# Patient Record
Sex: Female | Born: 1945 | Race: Black or African American | Hispanic: No | Marital: Married
Health system: Southern US, Community
[De-identification: ages and names within clinical notes are randomized; demographics above are authoritative.]

---

## 2013-01-31 ENCOUNTER — Other Ambulatory Visit: Payer: Self-pay

## 2013-01-31 DIAGNOSIS — Z1231 Encounter for screening mammogram for malignant neoplasm of breast: Secondary | ICD-10-CM

## 2013-02-19 ENCOUNTER — Ambulatory Visit: Payer: Self-pay

## 2013-03-13 ENCOUNTER — Ambulatory Visit: Payer: Self-pay

## 2013-12-28 ENCOUNTER — Ambulatory Visit (HOSPITAL_COMMUNITY)
Admission: RE | Admit: 2013-12-28 | Discharge: 2013-12-28 | Disposition: A | Discharge status: Home / Self Care | Payer: 59 | Source: Ambulatory Visit | Attending: Internal Medicine | Admitting: Internal Medicine

## 2013-12-28 ENCOUNTER — Other Ambulatory Visit (HOSPITAL_COMMUNITY): Payer: Self-pay | Admitting: Internal Medicine

## 2013-12-28 DIAGNOSIS — M47817 Spondylosis without myelopathy or radiculopathy, lumbosacral region: Secondary | ICD-10-CM | POA: Insufficient documentation

## 2013-12-28 DIAGNOSIS — M545 Low back pain, unspecified: Secondary | ICD-10-CM

## 2014-09-26 IMAGING — CR DG LUMBAR SPINE COMPLETE 4+V
5 series · 5 of 5 positions shown · non-contrast
Comparison: None.

CLINICAL DATA: Low back pain.

EXAM:
LUMBAR SPINE - COMPLETE 4+ VIEW

[t lumbar spine ap]
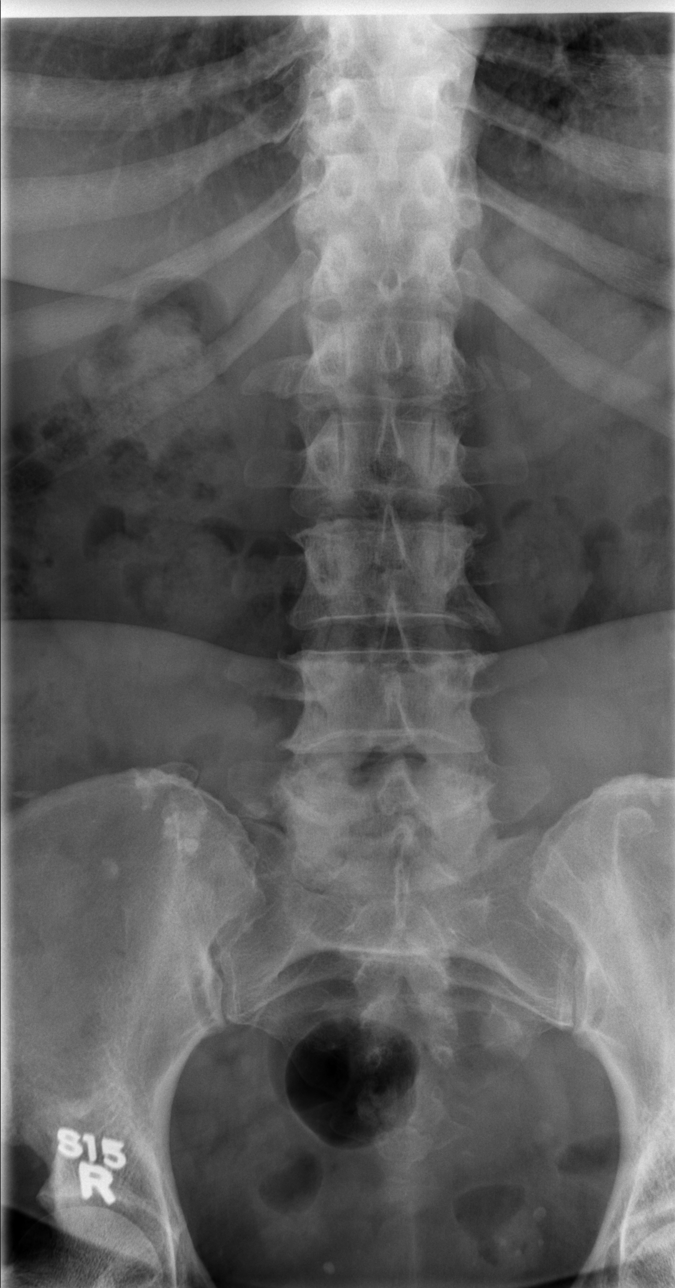

[t lumbar spine obl (1 of 2)]
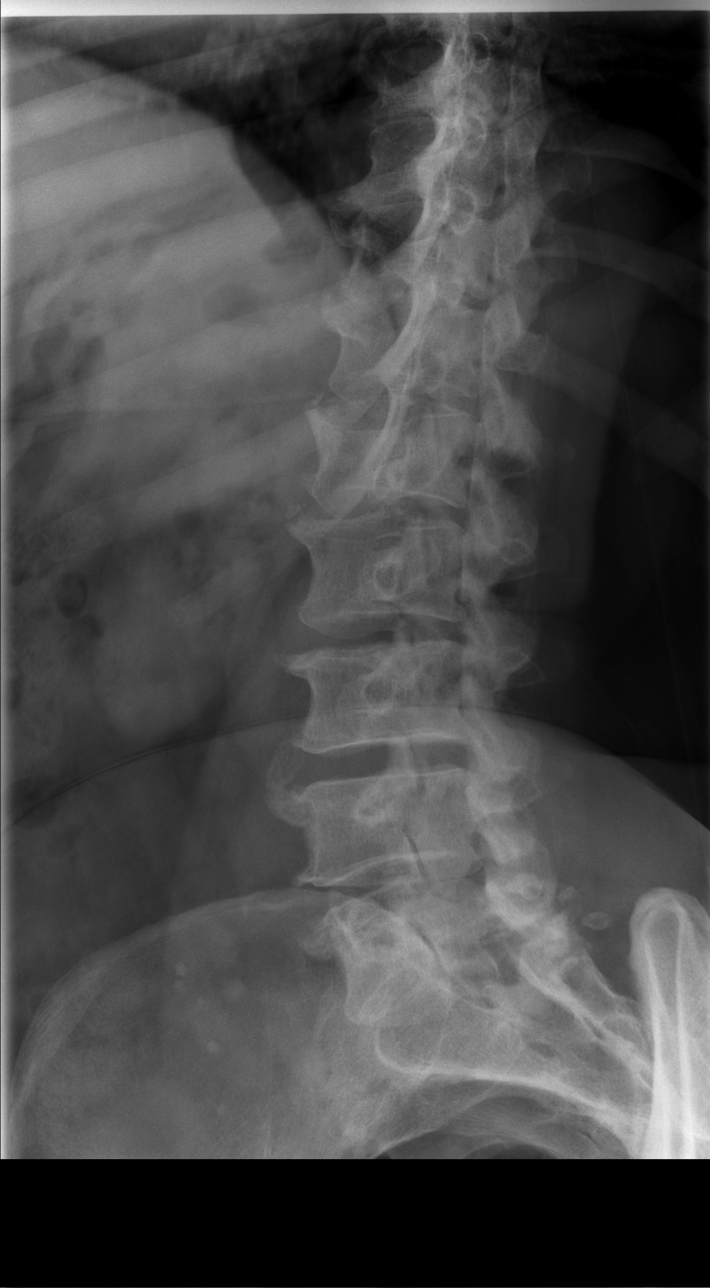

[t lumbar spine obl (2 of 2)]
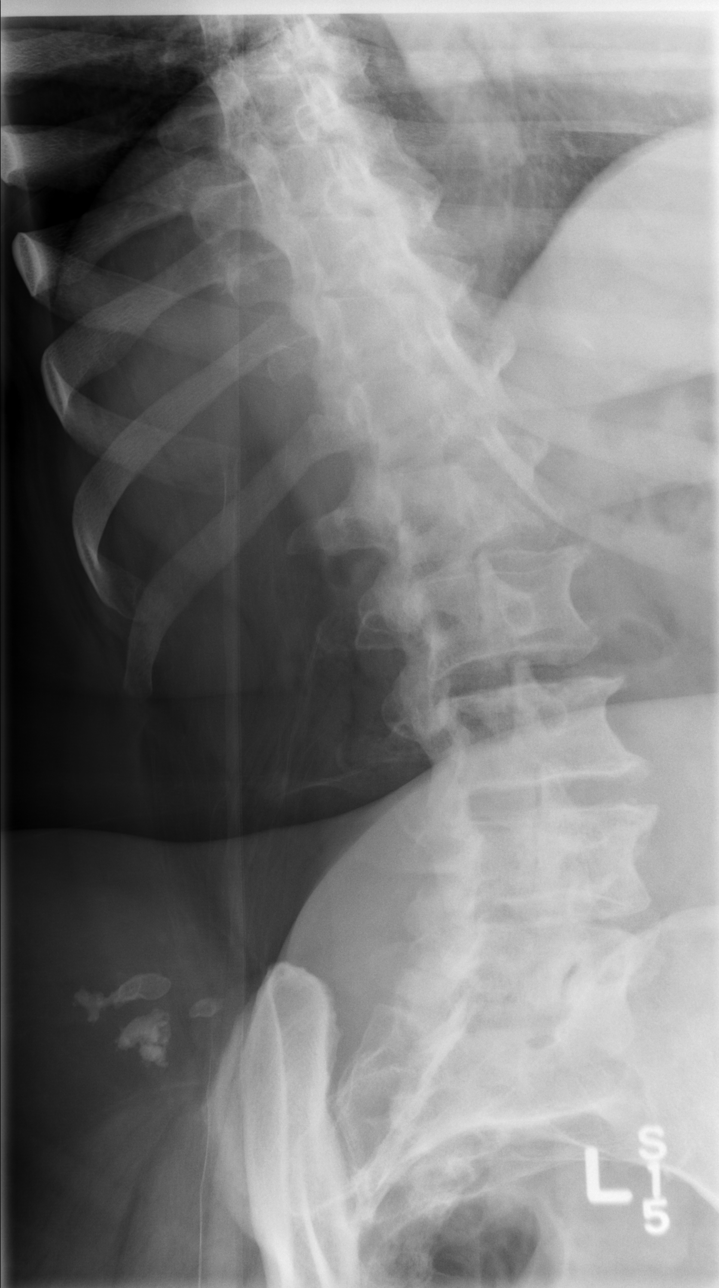

[t lumbar spine lat]
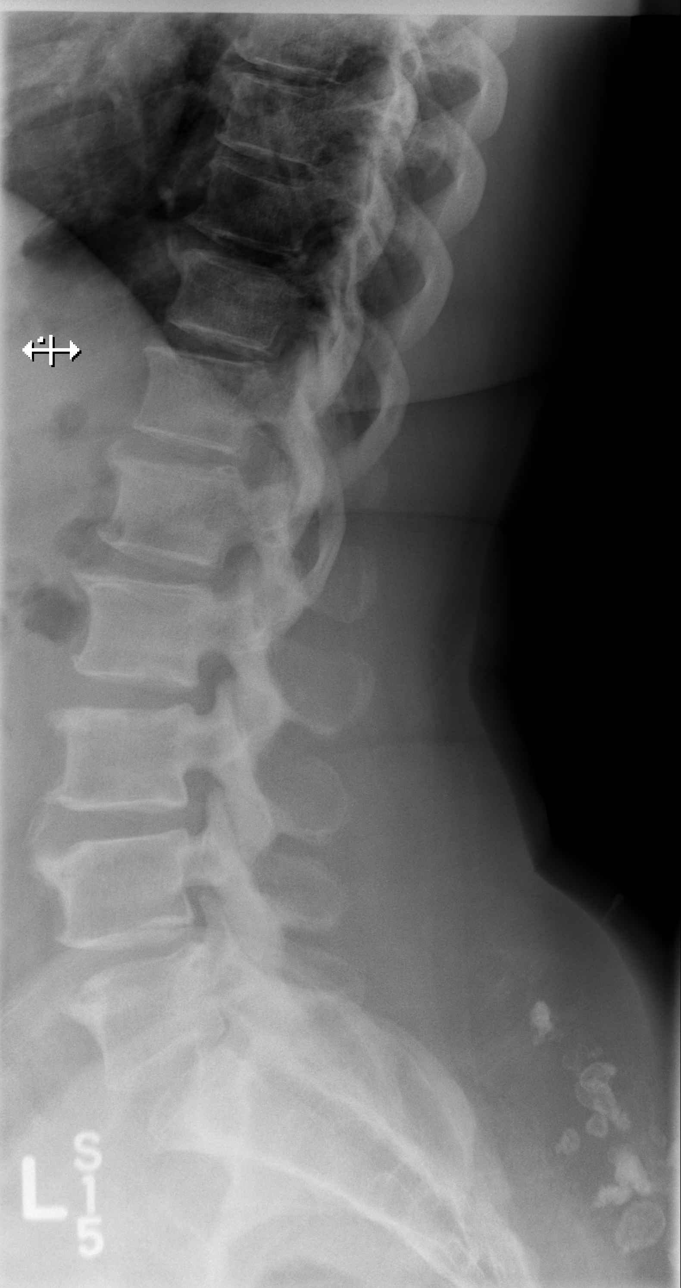

[t lumbar l-5 s-1 spot]
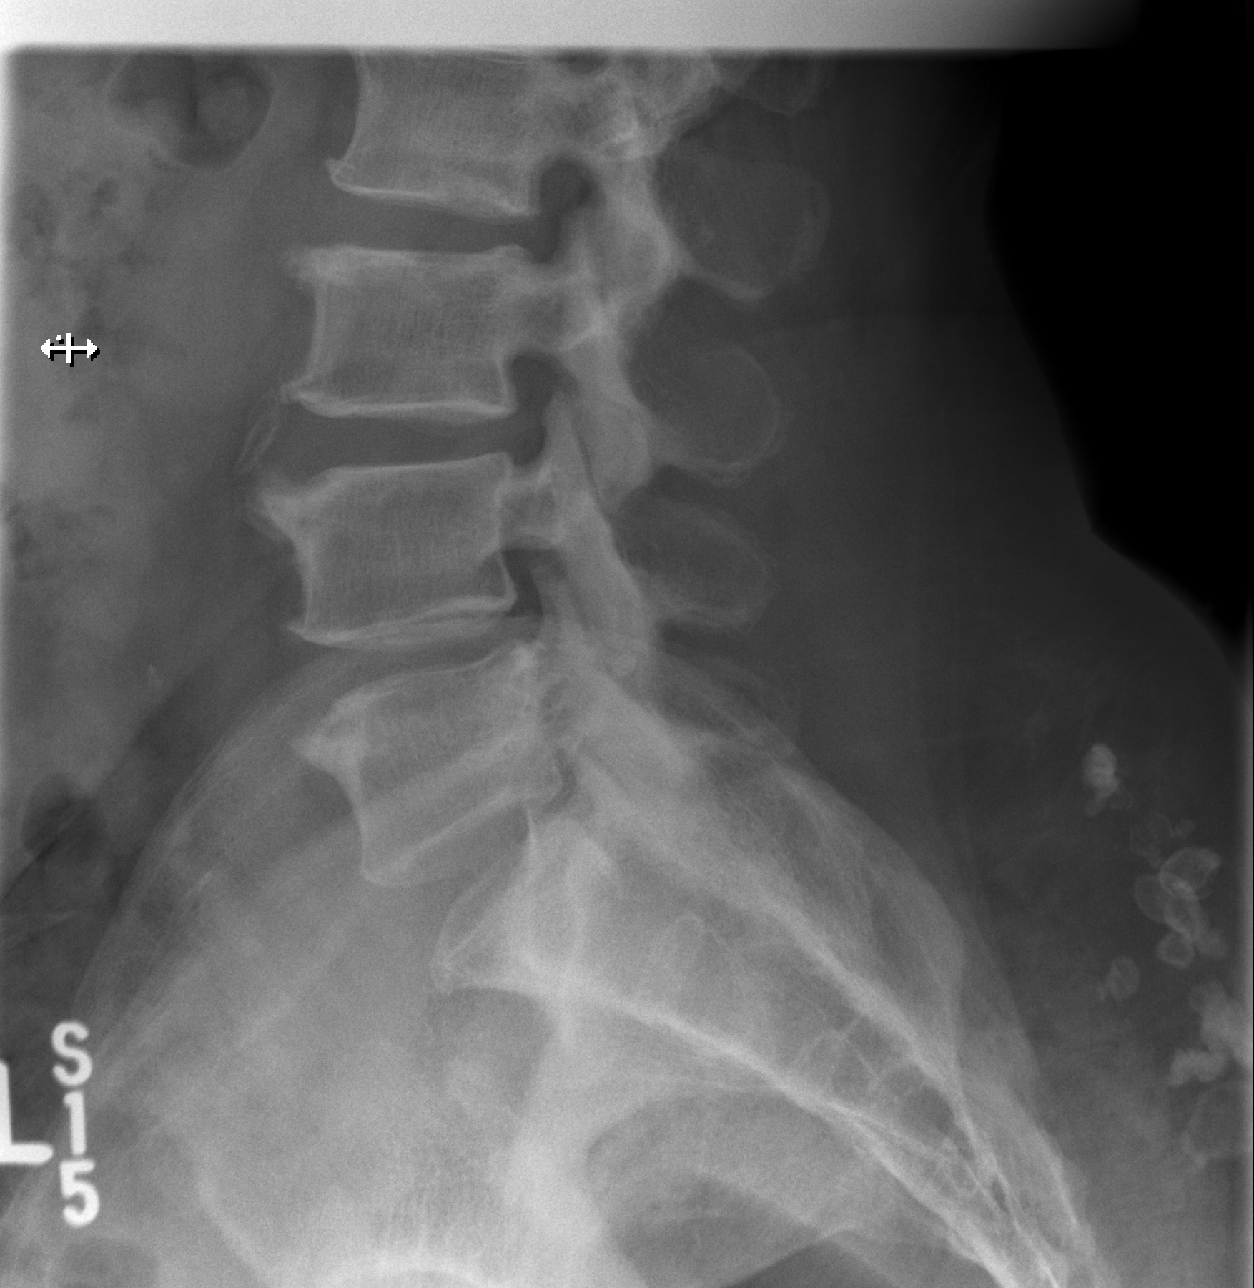

[5 of 5 positions shown; findings below may reference images not displayed]

FINDINGS: AP, lateral and oblique images of the lumbar spine were obtained.
Normal alignment of the lumbar spine. Endplate degenerative changes
in the lumbar spine. Facet arthropathy in the lower lumbar spine.
Mild disc space narrowing at L4-L5 and L5-S1. The vertebral body
heights are maintained. No evidence for a pars defect. Soft tissue
calcifications posterior to the sacrum. No acute bone abnormality.
IMPRESSION: Degenerative changes in the lumbar spine without acute bone
abnormality.
